# Patient Record
Sex: Male | Born: 1955 | Race: Black or African American | Hispanic: No | Marital: Single | Smoking: Never smoker
Health system: Southern US, Community
[De-identification: ages and names within clinical notes are randomized; demographics above are authoritative.]

## PROBLEM LIST (undated history)

## (undated) DIAGNOSIS — I10 Essential (primary) hypertension: Secondary | ICD-10-CM

## (undated) DIAGNOSIS — E7800 Pure hypercholesterolemia, unspecified: Secondary | ICD-10-CM

## (undated) DIAGNOSIS — E119 Type 2 diabetes mellitus without complications: Secondary | ICD-10-CM

## (undated) HISTORY — PX: OTHER SURGICAL HISTORY: SHX169

## (undated) HISTORY — PX: EYE SURGERY: SHX253

---

## 2012-06-13 DIAGNOSIS — R42 Dizziness and giddiness: Secondary | ICD-10-CM

## 2016-06-10 ENCOUNTER — Emergency Department (HOSPITAL_COMMUNITY): Payer: Medicaid - Out of State

## 2016-06-10 ENCOUNTER — Emergency Department (HOSPITAL_COMMUNITY)
Admission: EM | Admit: 2016-06-10 | Discharge: 2016-06-10 | Disposition: A | Discharge status: Home / Self Care | Payer: Medicaid - Out of State | Attending: Emergency Medicine | Admitting: Emergency Medicine

## 2016-06-10 ENCOUNTER — Encounter (HOSPITAL_COMMUNITY): Payer: Self-pay | Admitting: *Deleted

## 2016-06-10 DIAGNOSIS — Z79899 Other long term (current) drug therapy: Secondary | ICD-10-CM | POA: Insufficient documentation

## 2016-06-10 DIAGNOSIS — Z7982 Long term (current) use of aspirin: Secondary | ICD-10-CM | POA: Insufficient documentation

## 2016-06-10 DIAGNOSIS — R112 Nausea with vomiting, unspecified: Secondary | ICD-10-CM | POA: Diagnosis present

## 2016-06-10 DIAGNOSIS — I10 Essential (primary) hypertension: Secondary | ICD-10-CM | POA: Insufficient documentation

## 2016-06-10 DIAGNOSIS — Z7984 Long term (current) use of oral hypoglycemic drugs: Secondary | ICD-10-CM | POA: Insufficient documentation

## 2016-06-10 DIAGNOSIS — E860 Dehydration: Secondary | ICD-10-CM | POA: Insufficient documentation

## 2016-06-10 DIAGNOSIS — E119 Type 2 diabetes mellitus without complications: Secondary | ICD-10-CM | POA: Insufficient documentation

## 2016-06-10 HISTORY — DX: Type 2 diabetes mellitus without complications: E11.9

## 2016-06-10 HISTORY — DX: Essential (primary) hypertension: I10

## 2016-06-10 HISTORY — DX: Pure hypercholesterolemia, unspecified: E78.00

## 2016-06-10 LAB — URINALYSIS, ROUTINE W REFLEX MICROSCOPIC
Bilirubin Urine: NEGATIVE
GLUCOSE, UA: NEGATIVE mg/dL
KETONES UR: NEGATIVE mg/dL
LEUKOCYTES UA: NEGATIVE
Nitrite: NEGATIVE
PH: 5.5 (ref 5.0–8.0)
Specific Gravity, Urine: >1.03 — ABNORMAL HIGH (ref 1.005–1.030)

## 2016-06-10 LAB — CBC WITH DIFFERENTIAL/PLATELET
BASOS ABS: 0 10*3/uL (ref 0.0–0.1)
BASOS PCT: 0 %
Eosinophils Absolute: 0.1 10*3/uL (ref 0.0–0.7)
Eosinophils Relative: 1 %
HEMATOCRIT: 41.6 % (ref 39.0–52.0)
HEMOGLOBIN: 13.3 g/dL (ref 13.0–17.0)
LYMPHS PCT: 28 %
Lymphs Abs: 1.4 10*3/uL (ref 0.7–4.0)
MCH: 27.1 pg (ref 26.0–34.0)
MCHC: 32 g/dL (ref 30.0–36.0)
MCV: 84.7 fL (ref 78.0–100.0)
MONO ABS: 0.3 10*3/uL (ref 0.1–1.0)
Monocytes Relative: 5 %
NEUTROS ABS: 3.3 10*3/uL (ref 1.7–7.7)
NEUTROS PCT: 66 %
Platelets: 189 10*3/uL (ref 150–400)
RBC: 4.91 MIL/uL (ref 4.22–5.81)
RDW: 12.6 % (ref 11.5–15.5)
WBC: 5 10*3/uL (ref 4.0–10.5)

## 2016-06-10 LAB — RAPID URINE DRUG SCREEN, HOSP PERFORMED
AMPHETAMINES: NOT DETECTED
BARBITURATES: NOT DETECTED
BENZODIAZEPINES: NOT DETECTED
Cocaine: NOT DETECTED
Opiates: NOT DETECTED
Tetrahydrocannabinol: NOT DETECTED

## 2016-06-10 LAB — URINE MICROSCOPIC-ADD ON

## 2016-06-10 LAB — COMPREHENSIVE METABOLIC PANEL
ALBUMIN: 4.2 g/dL (ref 3.5–5.0)
ALT: 13 U/L — AB (ref 17–63)
AST: 16 U/L (ref 15–41)
Alkaline Phosphatase: 38 U/L (ref 38–126)
Anion gap: 5 (ref 5–15)
BILIRUBIN TOTAL: 0.6 mg/dL (ref 0.3–1.2)
BUN: 12 mg/dL (ref 6–20)
CO2: 24 mmol/L (ref 22–32)
CREATININE: 1.35 mg/dL — AB (ref 0.61–1.24)
Calcium: 8.5 mg/dL — ABNORMAL LOW (ref 8.9–10.3)
Chloride: 111 mmol/L (ref 101–111)
GFR calc Af Amer: >60 mL/min (ref >60)
GFR, EST NON AFRICAN AMERICAN: 55 mL/min — AB (ref >60)
GLUCOSE: 126 mg/dL — AB (ref 65–99)
POTASSIUM: 4.4 mmol/L (ref 3.5–5.1)
Sodium: 140 mmol/L (ref 135–145)
TOTAL PROTEIN: 7 g/dL (ref 6.5–8.1)

## 2016-06-10 LAB — CBG MONITORING, ED: Glucose-Capillary: 134 mg/dL — ABNORMAL HIGH (ref 65–99)

## 2016-06-10 MED ORDER — ONDANSETRON 4 MG PO TBDP: ORAL_TABLET | ORAL | 0 refills | Status: AC

## 2016-06-10 MED ORDER — LOPERAMIDE HCL 2 MG PO CAPS: 2.0000 mg | ORAL_CAPSULE | Freq: Four times a day (QID) | ORAL | 0 refills | Status: AC | PRN

## 2016-06-10 NOTE — ED Notes (Signed)
Patient transported to X-ray 

## 2016-06-10 NOTE — ED Provider Notes (Signed)
[facility] Provider Note   CSN: [id number] Arrival date & time: 06/10/16  1408     History   Chief Complaint Chief Complaint  Patient presents with  . Emesis    HPI Jacob Osborn is a 60 y.o. male.  Patient complains of nausea with diarrhea for a number days.   The history is provided by the patient. No language interpreter was used.  Emesis   This is a new problem. The current episode started more than 2 days ago. The problem occurs 2 to 4 times per day. The problem has not changed since onset.The emesis has an appearance of stomach contents. There has been no fever. Associated symptoms include diarrhea. Pertinent negatives include no abdominal pain, no chills, no cough and no headaches. Risk factors: Unknown.    Past Medical History:  Diagnosis Date  . Diabetes mellitus without complication (HCC)   . High cholesterol   . Hypertension     There are no active problems to display for this patient.   Past Surgical History:  Procedure Laterality Date  . EYE SURGERY         Home Medications    Prior to Admission medications   Medication Sig Start Date End Date Taking? Authorizing Provider  acyclovir (ZOVIRAX) 400 MG tablet Take 400 mg by mouth 2 (two) times daily as needed (BREAKOUT).  06/04/16  Yes Historical Provider, MD  amLODipine (NORVASC) 10 MG tablet Take 10 mg by mouth daily. 05/31/16  Yes Historical Provider, MD  ASPIRIN LOW DOSE 81 MG EC tablet Take 81 mg by mouth daily. 06/04/16  Yes Historical Provider, MD  atorvastatin (LIPITOR) 10 MG tablet Take 10 mg by mouth daily. 05/31/16  Yes Historical Provider, MD  glipiZIDE (GLUCOTROL) 10 MG tablet Take 10 mg by mouth 2 (two) times daily. 05/31/16  Yes Historical Provider, MD  lisinopril (PRINIVIL,ZESTRIL) 20 MG tablet Take 20 mg by mouth daily. 05/31/16  Yes Historical Provider, MD  meclizine (ANTIVERT) 25 MG tablet Take 25 mg by mouth 3 (three) times daily as needed for dizziness.  05/31/16  Yes Historical  Provider, MD  metFORMIN (GLUCOPHAGE) 500 MG tablet Take 500 mg by mouth 2 (two) times daily. 05/31/16  Yes Historical Provider, MD  loperamide (IMODIUM) 2 MG capsule Take 1 capsule (2 mg total) by mouth 4 (four) times daily as needed for diarrhea or loose stools. 06/10/16   Bethann Berkshire, MD  ondansetron (ZOFRAN ODT) 4 MG disintegrating tablet 4mg  ODT q4 hours prn nausea/vomit 06/10/16   Bethann Berkshire, MD    Family History No family history on file.  Social History Social History  Substance Use Topics  . Smoking status: Never Smoker  . Smokeless tobacco: Never Used  . Alcohol use No     Allergies   Review of patient's allergies indicates no known allergies.   Review of Systems Review of Systems  Constitutional: Negative for appetite change, chills and fatigue.  HENT: Negative for congestion, ear discharge and sinus pressure.   Eyes: Negative for discharge.  Respiratory: Negative for cough.   Cardiovascular: Negative for chest pain.  Gastrointestinal: Positive for diarrhea and vomiting. Negative for abdominal pain.  Genitourinary: Negative for frequency and hematuria.  Musculoskeletal: Negative for back pain.  Skin: Negative for rash.  Neurological: Negative for seizures and headaches.  Psychiatric/Behavioral: Negative for hallucinations.     Physical Exam Updated Vital Signs BP 160/86 (BP Location: Right Arm)   Pulse 62   Temp 98.3 F (36.8 C) (Oral)  Resp 16   Ht 5\' 7"  (1.702 m)   Wt 148 lb 1 oz (67.2 kg)   SpO2 100%   BMI 23.19 kg/m   Physical Exam  Constitutional: He is oriented to person, place, and time. He appears well-developed.  HENT:  Head: Normocephalic.  Eyes: Conjunctivae and EOM are normal. No scleral icterus.  Neck: Neck supple. No thyromegaly present.  Cardiovascular: Normal rate and regular rhythm.  Exam reveals no gallop and no friction rub.   No murmur heard. Pulmonary/Chest: No stridor. He has no wheezes. He has no rales. He exhibits no  tenderness.  Abdominal: He exhibits no distension. There is no tenderness. There is no rebound.  Musculoskeletal: Normal range of motion. He exhibits no edema.  Lymphadenopathy:    He has no cervical adenopathy.  Neurological: He is oriented to person, place, and time. He exhibits normal muscle tone. Coordination normal.  Skin: No rash noted. No erythema.  Psychiatric: He has a normal mood and affect. His behavior is normal.     ED Treatments / Results  Labs (all labs ordered are listed, but only abnormal results are displayed) Labs Reviewed  COMPREHENSIVE METABOLIC PANEL - Abnormal; Notable for the following:       Result Value   Glucose, Bld 126 (*)    Creatinine, Ser 1.35 (*)    Calcium 8.5 (*)    ALT 13 (*)    GFR calc non Af Amer 55 (*)    All other components within normal limits  URINALYSIS, ROUTINE W REFLEX MICROSCOPIC (NOT AT [facility]) - Abnormal; Notable for the following:    Specific Gravity, Urine >1.030 (*)    Hgb urine dipstick TRACE (*)    Protein, ur TRACE (*)    All other components within normal limits  URINE MICROSCOPIC-ADD ON - Abnormal; Notable for the following:    Squamous Epithelial / LPF 0-5 (*)    Bacteria, UA RARE (*)    All other components within normal limits  CBG MONITORING, ED - Abnormal; Notable for the following:    Glucose-Capillary 134 (*)    All other components within normal limits  CBC WITH DIFFERENTIAL/PLATELET  URINE RAPID DRUG SCREEN, HOSP PERFORMED    EKG  EKG Interpretation None       Radiology Dg Abd Acute W/chest  Result Date: 06/10/2016 CLINICAL DATA:  Diarrhea. EXAM: DG ABDOMEN ACUTE W/ 1V CHEST COMPARISON:  None. FINDINGS: Right colonic fluid levels correlating with diarrhea history. No signs of colonic ileus. Overall nonobstructive bowel gas pattern. No concerning mass effect or calcification. No pneumoperitoneum. There is no edema, consolidation, effusion, or pneumothorax. Normal heart size and mediastinal contours.  IMPRESSION: No acute finding in the chest or abdomen. Electronically Signed   By: Marnee Spring M.D.   On: 06/10/2016 16:35    Procedures Procedures (including critical care time)  Medications Ordered in ED Medications - No data to display   Initial Impression / Assessment and Plan / ED Course  I have reviewed the triage vital signs and the nursing notes.  Pertinent labs & imaging results that were available during my care of the patient were reviewed by me and considered in my medical decision making (see chart for details).  Clinical Course    Nausea with diarrhea. Normal labs normal x-ray. Patient is mildly dehydrated. Patient told to drink any fluids and follow-up with his PCP  Final Clinical Impressions(s) / ED Diagnoses   Final diagnoses:  Dehydration    New Prescriptions New Prescriptions  LOPERAMIDE (IMODIUM) 2 MG CAPSULE    Take 1 capsule (2 mg total) by mouth 4 (four) times daily as needed for diarrhea or loose stools.   ONDANSETRON (ZOFRAN ODT) 4 MG DISINTEGRATING TABLET    4mg  ODT q4 hours prn nausea/vomit     Bethann BerkshireJoseph Riverlyn Kizziah, MD 06/10/16 1807

## 2016-06-10 NOTE — Discharge Instructions (Signed)
Drink plenty of fluids and follow-up with your family doctor next week for recheck °

## 2016-06-10 NOTE — ED Triage Notes (Signed)
Pt c/o nausea for the past few weeks, diarrhea for the past 4 days, intermittent abd pain.

## 2016-08-25 ENCOUNTER — Encounter (HOSPITAL_COMMUNITY): Payer: Self-pay

## 2016-08-25 ENCOUNTER — Emergency Department (HOSPITAL_COMMUNITY)
Admission: EM | Admit: 2016-08-25 | Discharge: 2016-08-25 | Disposition: A | Discharge status: Home / Self Care | Payer: Medicaid - Out of State | Attending: Emergency Medicine | Admitting: Emergency Medicine

## 2016-08-25 DIAGNOSIS — M545 Low back pain: Secondary | ICD-10-CM | POA: Diagnosis present

## 2016-08-25 DIAGNOSIS — B0229 Other postherpetic nervous system involvement: Secondary | ICD-10-CM | POA: Diagnosis not present

## 2016-08-25 DIAGNOSIS — E119 Type 2 diabetes mellitus without complications: Secondary | ICD-10-CM | POA: Insufficient documentation

## 2016-08-25 DIAGNOSIS — Z7982 Long term (current) use of aspirin: Secondary | ICD-10-CM | POA: Insufficient documentation

## 2016-08-25 DIAGNOSIS — Z7984 Long term (current) use of oral hypoglycemic drugs: Secondary | ICD-10-CM | POA: Insufficient documentation

## 2016-08-25 DIAGNOSIS — Z79899 Other long term (current) drug therapy: Secondary | ICD-10-CM | POA: Diagnosis not present

## 2016-08-25 DIAGNOSIS — I10 Essential (primary) hypertension: Secondary | ICD-10-CM | POA: Diagnosis not present

## 2016-08-25 LAB — URINALYSIS, ROUTINE W REFLEX MICROSCOPIC
Bilirubin Urine: NEGATIVE
Glucose, UA: NEGATIVE mg/dL
Hgb urine dipstick: NEGATIVE
Ketones, ur: NEGATIVE mg/dL
LEUKOCYTES UA: NEGATIVE
Nitrite: NEGATIVE
PROTEIN: NEGATIVE mg/dL
Specific Gravity, Urine: 1.013 (ref 1.005–1.030)
pH: 6 (ref 5.0–8.0)

## 2016-08-25 LAB — I-STAT CHEM 8, ED
BUN: 14 mg/dL (ref 6–20)
CHLORIDE: 104 mmol/L (ref 101–111)
Calcium, Ion: 1.16 mmol/L (ref 1.15–1.40)
Creatinine, Ser: 1.2 mg/dL (ref 0.61–1.24)
Glucose, Bld: 100 mg/dL — ABNORMAL HIGH (ref 65–99)
HCT: 41 % (ref 39.0–52.0)
Hemoglobin: 13.9 g/dL (ref 13.0–17.0)
POTASSIUM: 4.3 mmol/L (ref 3.5–5.1)
SODIUM: 142 mmol/L (ref 135–145)
TCO2: 26 mmol/L (ref 0–100)

## 2016-08-25 MED ORDER — GABAPENTIN 100 MG PO CAPS: 100.0000 mg | ORAL_CAPSULE | Freq: Three times a day (TID) | ORAL | 0 refills | Status: AC

## 2016-08-25 NOTE — ED Triage Notes (Signed)
Patient reports of lower back pain that goes into pelvis. States he is also having burning "in shaft of my penis". States he wants blood work to make sure kidneys are working well. Currently taking Mupirocin for skin infection.

## 2016-08-25 NOTE — ED Provider Notes (Signed)
[facility] Provider Note   CSN: [id number] Arrival date & time: 08/25/16  1429     History   Chief Complaint Chief Complaint  Patient presents with  . Back Pain    HPI Jacob Osborn is a 60 y.o. male.  HPI Patient states that 4-5 days ago he had a "herpes outbreak". Described as blistering to his low back and groin area. This was on the right side. Since then he has had a burning sensation to the right low back wrapping around to the groin. States he was recently checked for herpes 1/2 and is test came back negative. He denies any injury, numbness or weakness. No dysuria, incontinence or penile discharge.  Past Medical History:  Diagnosis Date  . Diabetes mellitus without complication (HCC)   . High cholesterol   . Hypertension     There are no active problems to display for this patient.   Past Surgical History:  Procedure Laterality Date  . EYE SURGERY         Home Medications    Prior to Admission medications   Medication Sig Start Date End Date Taking? Authorizing Provider  acyclovir (ZOVIRAX) 400 MG tablet Take 400 mg by mouth 2 (two) times daily as needed (BREAKOUT).  06/04/16  Yes Historical Provider, MD  amLODipine (NORVASC) 10 MG tablet Take 10 mg by mouth daily. 05/31/16  Yes Historical Provider, MD  ASPIRIN LOW DOSE 81 MG EC tablet Take 81 mg by mouth daily. 06/04/16  Yes Historical Provider, MD  atorvastatin (LIPITOR) 10 MG tablet Take 10 mg by mouth daily. 05/31/16  Yes Historical Provider, MD  glipiZIDE (GLUCOTROL) 10 MG tablet Take 10 mg by mouth 2 (two) times daily. 05/31/16  Yes Historical Provider, MD  lisinopril (PRINIVIL,ZESTRIL) 20 MG tablet Take 20 mg by mouth daily. 05/31/16  Yes Historical Provider, MD  loratadine (CLARITIN) 10 MG tablet Take 10 mg by mouth daily.   Yes Historical Provider, MD  meclizine (ANTIVERT) 25 MG tablet Take 25 mg by mouth 3 (three) times daily as needed for dizziness.  05/31/16  Yes Historical Provider, MD  metFORMIN  (GLUCOPHAGE) 500 MG tablet Take 500 mg by mouth 2 (two) times daily. 05/31/16  Yes Historical Provider, MD  mupirocin ointment (BACTROBAN) 2 % Place 1 application into the nose daily as needed (for infection).   Yes Historical Provider, MD  ondansetron (ZOFRAN ODT) 4 MG disintegrating tablet 4mg  ODT q4 hours prn nausea/vomit Patient taking differently: Take 4 mg by mouth every 4 (four) hours as needed for nausea or vomiting.  06/10/16  Yes Bethann BerkshireJoseph Zammit, MD  gabapentin (NEURONTIN) 100 MG capsule Take 1 capsule (100 mg total) by mouth 3 (three) times daily. 08/25/16   Loren Raceravid Teresha Hanks, MD  loperamide (IMODIUM) 2 MG capsule Take 1 capsule (2 mg total) by mouth 4 (four) times daily as needed for diarrhea or loose stools. Patient not taking: Reported on 08/25/2016 06/10/16   Bethann BerkshireJoseph Zammit, MD    Family History No family history on file.  Social History Social History  Substance Use Topics  . Smoking status: Never Smoker  . Smokeless tobacco: Never Used  . Alcohol use No     Allergies   Patient has no known allergies.   Review of Systems Review of Systems  Constitutional: Negative for chills and fever.  Respiratory: Negative for shortness of breath.   Cardiovascular: Negative for chest pain.  Gastrointestinal: Negative for abdominal pain, constipation, diarrhea, nausea and vomiting.  Genitourinary: Negative for dysuria, flank  pain, frequency, hematuria, penile swelling, scrotal swelling and testicular pain.  Musculoskeletal: Positive for back pain. Negative for myalgias, neck pain and neck stiffness.  Skin: Negative for rash and wound.  Neurological: Negative for dizziness, weakness, light-headedness, numbness and headaches.  All other systems reviewed and are negative.    Physical Exam Updated Vital Signs BP 158/89 (BP Location: Left Arm)   Pulse (!) 54   Temp 98.4 F (36.9 C) (Oral)   Resp 18   Ht 5\' 7"  (1.702 m)   Wt 150 lb (68 kg)   SpO2 100%   BMI 23.49 kg/m   Physical  Exam  Constitutional: He is oriented to person, place, and time. He appears well-developed and well-nourished.  HENT:  Head: Normocephalic and atraumatic.  Mouth/Throat: Oropharynx is clear and moist.  Eyes: EOM are normal. Pupils are equal, round, and reactive to light.  Neck: Normal range of motion. Neck supple.  Cardiovascular: Normal rate and regular rhythm.   Pulmonary/Chest: Effort normal and breath sounds normal.  Abdominal: Soft. Bowel sounds are normal. There is no tenderness. There is no rebound and no guarding.  Genitourinary: Penis normal. No penile tenderness.  Genitourinary Comments: No inguinal lymphadenopathy. No penile discharge. No scrotal swelling or tenderness.  Musculoskeletal: Normal range of motion. He exhibits no edema or tenderness.  No CVA tenderness bilaterally. No midline thoracic or lumbar tenderness. Negative straight leg raise bilaterally. Distal pulses are intact. No lower extremity swelling, asymmetry or tenderness.  Neurological: He is alert and oriented to person, place, and time.  5/5 motor 70s. Sensation is fully intact. Patient is ambulating without any difficulty.  Skin: Skin is warm and dry. Capillary refill takes less than 2 seconds. No rash noted. No erythema.  Psychiatric: He has a normal mood and affect. His behavior is normal.  Nursing note and vitals reviewed.    ED Treatments / Results  Labs (all labs ordered are listed, but only abnormal results are displayed) Labs Reviewed  I-STAT CHEM 8, ED - Abnormal; Notable for the following:       Result Value   Glucose, Bld 100 (*)    All other components within normal limits  URINALYSIS, ROUTINE W REFLEX MICROSCOPIC    EKG  EKG Interpretation None       Radiology No results found.  Procedures Procedures (including critical care time)  Medications Ordered in ED Medications - No data to display   Initial Impression / Assessment and Plan / ED Course  I have reviewed the triage  vital signs and the nursing notes.  Pertinent labs & imaging results that were available during my care of the patient were reviewed by me and considered in my medical decision making (see chart for details).  Clinical Course    Patient apparently has postherpetic neuralgia. No red flag signs or symptoms. Will start on gabapentin and have follow-up with his primary physician. Return precautions given.  Final Clinical Impressions(s) / ED Diagnoses   Final diagnoses:  Postherpetic neuralgia    New Prescriptions Discharge Medication List as of 08/25/2016  9:31 PM    START taking these medications   Details  gabapentin (NEURONTIN) 100 MG capsule Take 1 capsule (100 mg total) by mouth 3 (three) times daily., Starting Tue 08/25/2016, Print         Loren Raceravid Quince Santana, MD 08/26/16 [id number]

## 2018-02-25 ENCOUNTER — Other Ambulatory Visit: Payer: Self-pay

## 2018-02-25 ENCOUNTER — Encounter (HOSPITAL_COMMUNITY): Payer: Self-pay | Admitting: Emergency Medicine

## 2018-02-25 ENCOUNTER — Emergency Department (HOSPITAL_COMMUNITY)
Admission: EM | Admit: 2018-02-25 | Discharge: 2018-02-25 | Disposition: A | Discharge status: Home / Self Care | Payer: Medicaid - Out of State | Attending: Emergency Medicine | Admitting: Emergency Medicine

## 2018-02-25 DIAGNOSIS — R300 Dysuria: Secondary | ICD-10-CM

## 2018-02-25 DIAGNOSIS — Z7984 Long term (current) use of oral hypoglycemic drugs: Secondary | ICD-10-CM | POA: Diagnosis not present

## 2018-02-25 DIAGNOSIS — E119 Type 2 diabetes mellitus without complications: Secondary | ICD-10-CM | POA: Diagnosis not present

## 2018-02-25 DIAGNOSIS — N342 Other urethritis: Secondary | ICD-10-CM

## 2018-02-25 DIAGNOSIS — I10 Essential (primary) hypertension: Secondary | ICD-10-CM | POA: Diagnosis not present

## 2018-02-25 LAB — URINALYSIS, ROUTINE W REFLEX MICROSCOPIC
Bilirubin Urine: NEGATIVE
GLUCOSE, UA: NEGATIVE mg/dL
Hgb urine dipstick: NEGATIVE
Ketones, ur: NEGATIVE mg/dL
Leukocytes, UA: NEGATIVE
Nitrite: NEGATIVE
PROTEIN: 30 mg/dL — AB
Specific Gravity, Urine: 1.019 (ref 1.005–1.030)
pH: 7 (ref 5.0–8.0)

## 2018-02-25 MED ORDER — CEPHALEXIN 500 MG PO CAPS: 500.0000 mg | ORAL_CAPSULE | Freq: Three times a day (TID) | ORAL | 0 refills | Status: AC

## 2018-02-25 NOTE — ED Triage Notes (Signed)
Patient complaining of burning with urination x 1 month. States he saw PCP over 2 weeks ago and was told he had blood and protein in his urine. States he also had ultrasound on kidneys and bladder.

## 2018-02-25 NOTE — Discharge Instructions (Addendum)
We saw you in the ER for pain with urination. The urine analysis here does show bacteria in your urine along with mild protein.  There is no blood in your urine.  Given that you are already on acyclovir, we think that you might need antibiotics to cover for a urinary tract infection.  Please take the medicines as prescribed.  Please follow-up with the primary care doctor in 1 week and the urologist as planned.  If you wanted further STD evaluation you may consider visiting the health department in [place].

## 2018-02-25 NOTE — ED Provider Notes (Signed)
[facility] EMERGENCY DEPARTMENT Provider Note   CSN: [id number] Arrival date & time: 02/25/18  1443     History   Chief Complaint Chief Complaint  Patient presents with  . Dysuria    HPI Jacob Osborn is a 62 y.o. male.  HPI  62 year old male with history of diabetes comes in with chief complaint of burning with urination. Patient states that he has had off and on burning with urination and also rash around his penis for the last 1 month.  He saw his PCP, and he was told that his urine analysis was normal, however there was blood and protein in his urine.  Patient subsequently got an ultrasound of the kidneys which was unremarkable for acute findings, and given a urologist follow-up on June 16.  Patient states that he continues to have off-and-on burning with urination. At the moment, there is no rash, but when he gets the breakout, he has lesion over his penis and itching around his rectum. Patient was started on acyclovir by his PCP, but it does not seem like it has helped.  Patient also informs me that he had STD check done by his PCP which was normal, and that he has not had any intercourse in the last 1.5 years.   Past Medical History:  Diagnosis Date  . Diabetes mellitus without complication (HCC)   . High cholesterol   . Hypertension     There are no active problems to display for this patient.   Past Surgical History:  Procedure Laterality Date  . EYE SURGERY         Home Medications    Prior to Admission medications   Medication Sig Start Date End Date Taking? Authorizing Provider  acyclovir (ZOVIRAX) 400 MG tablet Take 400 mg by mouth 2 (two) times daily as needed (BREAKOUT).  06/04/16   [provider]  amLODipine (NORVASC) 10 MG tablet Take 10 mg by mouth daily. 05/31/16   [provider]  ASPIRIN LOW DOSE 81 MG EC tablet Take 81 mg by mouth daily. 06/04/16   [provider]  atorvastatin (LIPITOR) 10 MG tablet Take 10 mg by mouth  daily. 05/31/16   [provider]  cephALEXin (KEFLEX) 500 MG capsule Take 1 capsule (500 mg total) by mouth 3 (three) times daily. 02/25/18   Derwood KaplanNanavati, Trixy Loyola, MD  gabapentin (NEURONTIN) 100 MG capsule Take 1 capsule (100 mg total) by mouth 3 (three) times daily. 08/25/16   Loren RacerYelverton, David, MD  glipiZIDE (GLUCOTROL) 10 MG tablet Take 10 mg by mouth 2 (two) times daily. 05/31/16   [provider]  lisinopril (PRINIVIL,ZESTRIL) 20 MG tablet Take 20 mg by mouth daily. 05/31/16   [provider]  loperamide (IMODIUM) 2 MG capsule Take 1 capsule (2 mg total) by mouth 4 (four) times daily as needed for diarrhea or loose stools. Patient not taking: Reported on 08/25/2016 06/10/16   Bethann BerkshireZammit, Joseph, MD  loratadine (CLARITIN) 10 MG tablet Take 10 mg by mouth daily.    [provider]  meclizine (ANTIVERT) 25 MG tablet Take 25 mg by mouth 3 (three) times daily as needed for dizziness.  05/31/16   [provider]  metFORMIN (GLUCOPHAGE) 500 MG tablet Take 500 mg by mouth 2 (two) times daily. 05/31/16   [provider]  mupirocin ointment (BACTROBAN) 2 % Place 1 application into the nose daily as needed (for infection).    [provider]  ondansetron (ZOFRAN ODT) 4 MG disintegrating tablet 4mg  ODT  q4 hours prn nausea/vomit Patient taking differently: Take 4 mg by mouth every 4 (four) hours as needed for nausea or vomiting.  06/10/16   Bethann Berkshire, MD    Family History History reviewed. No pertinent family history.  Social History Social History   Tobacco Use  . Smoking status: Never Smoker  . Smokeless tobacco: Never Used  Substance Use Topics  . Alcohol use: No  . Drug use: No     Allergies   Patient has no known allergies.   Review of Systems Review of Systems  Constitutional: Negative for activity change and fever.  Gastrointestinal: Negative for nausea and vomiting.  Genitourinary: Positive for dysuria. Negative for discharge,  penile pain and penile swelling.     Physical Exam Updated Vital Signs BP (!) 164/86 (BP Location: Right Arm)   Pulse 63   Temp 97.9 F (36.6 C) (Oral)   Resp 16   Ht 5\' 7"  (1.702 m)   Wt 68.9 kg (152 lb)   SpO2 100%   BMI 23.81 kg/m   Physical Exam  Constitutional: He is oriented to person, place, and time. He appears well-developed.  HENT:  Head: Atraumatic.  Neck: Neck supple.  Cardiovascular: Normal rate.  Pulmonary/Chest: Effort normal.  Abdominal: Soft.  Genitourinary: Penis normal.  Genitourinary Comments: No inguinal lymphadenopathy  Neurological: He is alert and oriented to person, place, and time.  Skin: Skin is warm.  Nursing note and vitals reviewed.     ED Treatments / Results  Labs (all labs ordered are listed, but only abnormal results are displayed) Labs Reviewed  URINALYSIS, ROUTINE W REFLEX MICROSCOPIC - Abnormal; Notable for the following components:      Result Value   Protein, ur 30 (*)    Bacteria, UA MANY (*)    All other components within normal limits  URINE CULTURE    EKG None  Radiology No results found.  Procedures Procedures (including critical care time)  Medications Ordered in ED Medications - No data to display   Initial Impression / Assessment and Plan / ED Course  I have reviewed the triage vital signs and the nursing notes.  Pertinent labs & imaging results that were available during my care of the patient were reviewed by me and considered in my medical decision making (see chart for details).     62 year old patient with history of diabetes comes in with chief complaint of pain with urination.  Patient is not at risk for STDs, and reports that he had STD evaluation done by his PCP which was normal.  He states that he has been having intermittent outbreak of a rash, but at the moment there is no appreciable rash over his penis.  Patient also has had rectal irritation along with the dysuria.  No penile discharge.   UA today shows many bacteria.  Clinical concern would be highest for urethritis-UTI.  We will start patient on Keflex.  Patient is supposed to see urologist next week.  He already has had a urinary ultrasound which was unremarkable.  I have advised patient to continue seeing his primary care doctor and the urologist for further care, given there does not appear to be any emergent pathology in place.  Final Clinical Impressions(s) / ED Diagnoses   Final diagnoses:  Dysuria  Urethritis    ED Discharge Orders        Ordered    cephALEXin (KEFLEX) 500 MG capsule  3 times daily     02/25/18 1906  Derwood Kaplan, MD 02/25/18 [phone]

## 2018-02-27 LAB — URINE CULTURE: CULTURE: NO GROWTH

## 2018-11-13 ENCOUNTER — Other Ambulatory Visit: Payer: Self-pay

## 2018-11-13 ENCOUNTER — Emergency Department (HOSPITAL_COMMUNITY)
Admission: EM | Admit: 2018-11-13 | Discharge: 2018-11-13 | Disposition: A | Discharge status: Home / Self Care | Payer: Medicaid - Out of State | Attending: Emergency Medicine | Admitting: Emergency Medicine

## 2018-11-13 ENCOUNTER — Emergency Department (HOSPITAL_COMMUNITY): Payer: Medicaid - Out of State

## 2018-11-13 ENCOUNTER — Encounter (HOSPITAL_COMMUNITY): Payer: Self-pay | Admitting: Emergency Medicine

## 2018-11-13 DIAGNOSIS — I10 Essential (primary) hypertension: Secondary | ICD-10-CM | POA: Insufficient documentation

## 2018-11-13 DIAGNOSIS — Y999 Unspecified external cause status: Secondary | ICD-10-CM | POA: Diagnosis not present

## 2018-11-13 DIAGNOSIS — S39012A Strain of muscle, fascia and tendon of lower back, initial encounter: Secondary | ICD-10-CM | POA: Diagnosis not present

## 2018-11-13 DIAGNOSIS — E119 Type 2 diabetes mellitus without complications: Secondary | ICD-10-CM | POA: Insufficient documentation

## 2018-11-13 DIAGNOSIS — Z79899 Other long term (current) drug therapy: Secondary | ICD-10-CM | POA: Diagnosis not present

## 2018-11-13 DIAGNOSIS — Y939 Activity, unspecified: Secondary | ICD-10-CM | POA: Insufficient documentation

## 2018-11-13 DIAGNOSIS — Z7982 Long term (current) use of aspirin: Secondary | ICD-10-CM | POA: Diagnosis not present

## 2018-11-13 DIAGNOSIS — Z7984 Long term (current) use of oral hypoglycemic drugs: Secondary | ICD-10-CM | POA: Insufficient documentation

## 2018-11-13 DIAGNOSIS — Y929 Unspecified place or not applicable: Secondary | ICD-10-CM | POA: Insufficient documentation

## 2018-11-13 DIAGNOSIS — X509XXA Other and unspecified overexertion or strenuous movements or postures, initial encounter: Secondary | ICD-10-CM | POA: Diagnosis not present

## 2018-11-13 DIAGNOSIS — S3992XA Unspecified injury of lower back, initial encounter: Secondary | ICD-10-CM | POA: Diagnosis present

## 2018-11-13 MED ORDER — NAPROXEN 500 MG PO TABS: 500.0000 mg | ORAL_TABLET | Freq: Two times a day (BID) | ORAL | 0 refills | Status: AC

## 2018-11-13 MED ORDER — METHOCARBAMOL 750 MG PO TABS: 750.0000 mg | ORAL_TABLET | Freq: Four times a day (QID) | ORAL | 0 refills | Status: AC

## 2018-11-13 NOTE — ED Notes (Signed)
Pt reports low back pain for 5 days  Has physician in [place] but has not seen   Denies incontinence or numbness

## 2018-11-13 NOTE — ED Provider Notes (Signed)
[facility] EMERGENCY DEPARTMENT Provider Note   CSN: [id number] Arrival date & time: 11/13/18  1344    History   Chief Complaint Chief Complaint  Patient presents with  . Back Pain    HPI Jacob Osborn is a 63 y.o. male with a history of diabetes, hypertension and hypercholesterolemia presenting with a 5-day history of low back pain.  He reports occasional episodes of similar pain but not lasting as long as 5 days.  He denies any obvious injury, lifting accidents or falls.  His pain is worsened with certain positions and with change in position.  Lying flat on his back is particularly uncomfortable for him.  There is radiation of pain into his right lateral lower back.  There is no pain, weakness or numbness radiated into either leg.  He denies urinary or fecal incontinence or retention.  He also denies fevers or chills, no rash or other skin changes.  He has used ibuprofen and Tylenol without improvement in symptoms.  No cancer history, no IV drug use history.     The history is provided by the patient.    Past Medical History:  Diagnosis Date  . Diabetes mellitus without complication (HCC)   . High cholesterol   . Hypertension     There are no active problems to display for this patient.   Past Surgical History:  Procedure Laterality Date  . EYE SURGERY          Home Medications    Prior to Admission medications   Medication Sig Start Date End Date Taking? Authorizing Provider  acyclovir (ZOVIRAX) 400 MG tablet Take 400 mg by mouth 2 (two) times daily as needed (BREAKOUT).  06/04/16   [provider]  amLODipine (NORVASC) 10 MG tablet Take 10 mg by mouth daily. 05/31/16   [provider]  ASPIRIN LOW DOSE 81 MG EC tablet Take 81 mg by mouth daily. 06/04/16   [provider]  atorvastatin (LIPITOR) 10 MG tablet Take 10 mg by mouth daily. 05/31/16   [provider]  cephALEXin (KEFLEX) 500 MG capsule Take 1 capsule (500 mg total) by  mouth 3 (three) times daily. 02/25/18   Derwood Kaplan, MD  gabapentin (NEURONTIN) 100 MG capsule Take 1 capsule (100 mg total) by mouth 3 (three) times daily. 08/25/16   Loren Racer, MD  glipiZIDE (GLUCOTROL) 10 MG tablet Take 10 mg by mouth 2 (two) times daily. 05/31/16   [provider]  lisinopril (PRINIVIL,ZESTRIL) 20 MG tablet Take 20 mg by mouth daily. 05/31/16   [provider]  loperamide (IMODIUM) 2 MG capsule Take 1 capsule (2 mg total) by mouth 4 (four) times daily as needed for diarrhea or loose stools. Patient not taking: Reported on 08/25/2016 06/10/16   Bethann Berkshire, MD  loratadine (CLARITIN) 10 MG tablet Take 10 mg by mouth daily.    [provider]  meclizine (ANTIVERT) 25 MG tablet Take 25 mg by mouth 3 (three) times daily as needed for dizziness.  05/31/16   [provider]  metFORMIN (GLUCOPHAGE) 500 MG tablet Take 500 mg by mouth 2 (two) times daily. 05/31/16   [provider]  methocarbamol (ROBAXIN-750) 750 MG tablet Take 1 tablet (750 mg total) by mouth 4 (four) times daily. 11/13/18   Burgess Amor, PA-C  mupirocin ointment (BACTROBAN) 2 % Place 1 application into the nose daily as needed (for infection).    [provider]  naproxen (NAPROSYN) 500 MG tablet Take 1 tablet (500 mg  total) by mouth 2 (two) times daily. 11/13/18   Burgess Amor, PA-C  ondansetron (ZOFRAN ODT) 4 MG disintegrating tablet 4mg  ODT q4 hours prn nausea/vomit Patient taking differently: Take 4 mg by mouth every 4 (four) hours as needed for nausea or vomiting.  06/10/16   Bethann Berkshire, MD    Family History History reviewed. No pertinent family history.  Social History Social History   Tobacco Use  . Smoking status: Never Smoker  . Smokeless tobacco: Never Used  Substance Use Topics  . Alcohol use: No  . Drug use: No     Allergies   Patient has no known allergies.   Review of Systems Review of Systems  Constitutional: Negative for  fever.  Respiratory: Negative for shortness of breath.   Cardiovascular: Negative for chest pain and leg swelling.  Gastrointestinal: Negative for abdominal distention, abdominal pain and constipation.  Genitourinary: Negative for difficulty urinating, dysuria, flank pain, frequency and urgency.  Musculoskeletal: Positive for back pain. Negative for gait problem and joint swelling.  Skin: Negative for rash.  Neurological: Negative for weakness and numbness.     Physical Exam Updated Vital Signs BP 139/76 (BP Location: Right Arm)   Pulse 82   Temp 98.3 F (36.8 C) (Oral)   Resp 17   Ht 5\' 9"  (1.753 m)   Wt 68 kg   SpO2 100%   BMI 22.15 kg/m   Physical Exam Vitals signs and nursing note reviewed.  Constitutional:      Appearance: He is well-developed.  HENT:     Head: Normocephalic.  Eyes:     Conjunctiva/sclera: Conjunctivae normal.  Neck:     Musculoskeletal: Normal range of motion and neck supple.  Cardiovascular:     Rate and Rhythm: Normal rate.     Comments: Pedal pulses normal. Pulmonary:     Effort: Pulmonary effort is normal.  Abdominal:     General: Bowel sounds are normal. There is no distension.     Palpations: Abdomen is soft. There is no mass.  Musculoskeletal: Normal range of motion.     Lumbar back: He exhibits tenderness. He exhibits no swelling, no edema and no spasm.       Back:  Skin:    General: Skin is warm and dry.  Neurological:     Mental Status: He is alert.     Sensory: No sensory deficit.     Motor: No tremor or atrophy.     Gait: Gait normal.     Deep Tendon Reflexes:     Reflex Scores:      Patellar reflexes are 2+ on the right side and 2+ on the left side.      Achilles reflexes are 2+ on the right side and 2+ on the left side.    Comments: No strength deficit noted in hip and knee flexor and extensor muscle groups.  Ankle flexion and extension intact.      ED Treatments / Results  Labs (all labs ordered are listed, but only  abnormal results are displayed) Labs Reviewed - No data to display  EKG None  Radiology Dg Lumbar Spine Complete  Result Date: 11/13/2018 CLINICAL DATA:  Low back pain EXAM: LUMBAR SPINE - COMPLETE 4+ VIEW COMPARISON:  None. 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 Disc spaces maintained. Normal alignment. No fracture. SI joints symmetric and unremarkable. 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 No acute bony abnormality. Electronically Signed   By: Caryn Bee  Dover M.D.   On: 11/13/2018 16:17    Procedures Procedures (including critical care time)  Medications Ordered in ED Medications - No data to display   Initial Impression / Assessment and Plan / ED Course  I have reviewed the triage vital signs and the nursing notes.  Pertinent labs & imaging results that were available during my care of the patient were reviewed by me and considered in my medical decision making (see chart for details).        No neuro deficit on exam or by history to suggest emergent or surgical presentation.  discussed worsened sx that should prompt immediate re-evaluation including distal weakness, bowel/bladder retention/incontinence.      Final Clinical Impressions(s) / ED Diagnoses   Final diagnoses:  Strain of lumbar region, initial encounter    ED Discharge Orders         Ordered    methocarbamol (ROBAXIN-750) 750 MG tablet  4 times daily     11/13/18 1626    naproxen (NAPROSYN) 500 MG tablet  2 times daily     11/13/18 1626           Burgess Amor, Cordelia Poche 11/14/18 1306    Donnetta Hutching, MD 11/16/18 [phone]

## 2018-11-13 NOTE — ED Triage Notes (Signed)
Patient complains for lower back pain x 5 days. States pain is worse at night when he is lying down.

## 2018-11-13 NOTE — ED Notes (Signed)
To rad 

## 2018-11-13 NOTE — Discharge Instructions (Signed)
Do not drive within 4 hours of taking hydrocodone as this will make you drowsy.  Avoid lifting,  Bending,  Twisting or any other activity that worsens your pain over the next week.  Apply a heating pad to your lower back for 20 minutes several times daily. You should get rechecked if your symptoms are not better over the next 5 days,  Or you develop increased pain,  Weakness in your leg(s) or loss of bladder or bowel function - these are symptoms of a worsening condition.

## 2018-11-29 ENCOUNTER — Emergency Department (HOSPITAL_COMMUNITY): Payer: Medicaid - Out of State

## 2018-11-29 ENCOUNTER — Emergency Department (HOSPITAL_COMMUNITY)
Admission: EM | Admit: 2018-11-29 | Discharge: 2018-11-29 | Disposition: A | Discharge status: Home / Self Care | Payer: Medicaid - Out of State | Attending: Emergency Medicine | Admitting: Emergency Medicine

## 2018-11-29 ENCOUNTER — Encounter (HOSPITAL_COMMUNITY): Payer: Self-pay

## 2018-11-29 ENCOUNTER — Other Ambulatory Visit: Payer: Self-pay

## 2018-11-29 DIAGNOSIS — I10 Essential (primary) hypertension: Secondary | ICD-10-CM | POA: Insufficient documentation

## 2018-11-29 DIAGNOSIS — Z7984 Long term (current) use of oral hypoglycemic drugs: Secondary | ICD-10-CM | POA: Diagnosis not present

## 2018-11-29 DIAGNOSIS — R5383 Other fatigue: Secondary | ICD-10-CM

## 2018-11-29 DIAGNOSIS — Z79899 Other long term (current) drug therapy: Secondary | ICD-10-CM | POA: Diagnosis not present

## 2018-11-29 DIAGNOSIS — Z7982 Long term (current) use of aspirin: Secondary | ICD-10-CM | POA: Insufficient documentation

## 2018-11-29 DIAGNOSIS — H9203 Otalgia, bilateral: Secondary | ICD-10-CM | POA: Insufficient documentation

## 2018-11-29 DIAGNOSIS — K5900 Constipation, unspecified: Secondary | ICD-10-CM

## 2018-11-29 DIAGNOSIS — E119 Type 2 diabetes mellitus without complications: Secondary | ICD-10-CM | POA: Insufficient documentation

## 2018-11-29 LAB — CBC WITH DIFFERENTIAL/PLATELET
Abs Immature Granulocytes: 0.01 10*3/uL (ref 0.00–0.07)
Basophils Absolute: 0.1 10*3/uL (ref 0.0–0.1)
Basophils Relative: 1 %
EOS ABS: 0.1 10*3/uL (ref 0.0–0.5)
Eosinophils Relative: 2 %
HEMATOCRIT: 40.2 % (ref 39.0–52.0)
Hemoglobin: 12.4 g/dL — ABNORMAL LOW (ref 13.0–17.0)
Immature Granulocytes: 0 %
LYMPHS ABS: 2 10*3/uL (ref 0.7–4.0)
Lymphocytes Relative: 42 %
MCH: 26.7 pg (ref 26.0–34.0)
MCHC: 30.8 g/dL (ref 30.0–36.0)
MCV: 86.6 fL (ref 80.0–100.0)
Monocytes Absolute: 0.4 10*3/uL (ref 0.1–1.0)
Monocytes Relative: 9 %
Neutro Abs: 2.1 10*3/uL (ref 1.7–7.7)
Neutrophils Relative %: 46 %
Platelets: 219 10*3/uL (ref 150–400)
RBC: 4.64 MIL/uL (ref 4.22–5.81)
RDW: 12.4 % (ref 11.5–15.5)
WBC: 4.7 10*3/uL (ref 4.0–10.5)
nRBC: 0 % (ref 0.0–0.2)

## 2018-11-29 LAB — BASIC METABOLIC PANEL
Anion gap: 7 (ref 5–15)
BUN: 24 mg/dL — ABNORMAL HIGH (ref 8–23)
CALCIUM: 9.2 mg/dL (ref 8.9–10.3)
CO2: 24 mmol/L (ref 22–32)
Chloride: 106 mmol/L (ref 98–111)
Creatinine, Ser: 1.49 mg/dL — ABNORMAL HIGH (ref 0.61–1.24)
GFR calc Af Amer: 57 mL/min — ABNORMAL LOW (ref >60)
GFR calc non Af Amer: 49 mL/min — ABNORMAL LOW (ref >60)
Glucose, Bld: 160 mg/dL — ABNORMAL HIGH (ref 70–99)
Potassium: 4.8 mmol/L (ref 3.5–5.1)
Sodium: 137 mmol/L (ref 135–145)

## 2018-11-29 LAB — TROPONIN I: Troponin I: <0.03 ng/mL (ref <0.03)

## 2018-11-29 MED ORDER — PEG 3350-KCL-NA BICARB-NACL 420 G PO SOLR: 4000.0000 mL | Freq: Once | ORAL | 0 refills | Status: AC

## 2018-11-29 MED ORDER — SODIUM CHLORIDE 0.9 % IV BOLUS
1000.0000 mL | Freq: Once | INTRAVENOUS | Status: AC
  Administered 2018-11-29: 1000 mL via INTRAVENOUS

## 2018-11-29 NOTE — ED Provider Notes (Signed)
[facility] EMERGENCY DEPARTMENT Provider Note   CSN: [id number] Arrival date & time: 11/29/18  0827    History   Chief Complaint Chief Complaint  Patient presents with  . Otalgia  . Constipation    HPI Jacob Osborn is a 63 y.o. male.     Patient is a 63 year-old male with history of DM, HTN, Hyperlipidemia.  He presents today with complaints of multiple issues.  Most notably, he reports constipation for the past 6 days.  He has not had a bowel movement during this period of time.  He states he is tried magnesium citrate with no relief.  He denies fevers or chills.  He denies abdominal pain.  He denies nausea or vomiting.  He also complains of pressure in his ears, weakness/fatigue, feeling short of breath with exertion, blurry vision, and states that he developed chest pressure while driving here this morning.  This has resolved.  The history is provided by the patient.    Past Medical History:  Diagnosis Date  . Diabetes mellitus without complication (HCC)   . High cholesterol   . Hypertension     There are no active problems to display for this patient.   Past Surgical History:  Procedure Laterality Date  . EYE SURGERY    . hypercholesterol          Home Medications    Prior to Admission medications   Medication Sig Start Date End Date Taking? Authorizing Provider  acyclovir (ZOVIRAX) 400 MG tablet Take 400 mg by mouth 2 (two) times daily as needed (BREAKOUT).  06/04/16   [provider]  amLODipine (NORVASC) 10 MG tablet Take 10 mg by mouth daily. 05/31/16   [provider]  ASPIRIN LOW DOSE 81 MG EC tablet Take 81 mg by mouth daily. 06/04/16   [provider]  atorvastatin (LIPITOR) 10 MG tablet Take 10 mg by mouth daily. 05/31/16   [provider]  cephALEXin (KEFLEX) 500 MG capsule Take 1 capsule (500 mg total) by mouth 3 (three) times daily. 02/25/18   Derwood Kaplan, MD  gabapentin (NEURONTIN) 100 MG capsule Take 1 capsule  (100 mg total) by mouth 3 (three) times daily. 08/25/16   Loren Racer, MD  glipiZIDE (GLUCOTROL) 10 MG tablet Take 10 mg by mouth 2 (two) times daily. 05/31/16   [provider]  lisinopril (PRINIVIL,ZESTRIL) 20 MG tablet Take 20 mg by mouth daily. 05/31/16   [provider]  loperamide (IMODIUM) 2 MG capsule Take 1 capsule (2 mg total) by mouth 4 (four) times daily as needed for diarrhea or loose stools. Patient not taking: Reported on 08/25/2016 06/10/16   Bethann Berkshire, MD  loratadine (CLARITIN) 10 MG tablet Take 10 mg by mouth daily.    [provider]  meclizine (ANTIVERT) 25 MG tablet Take 25 mg by mouth 3 (three) times daily as needed for dizziness.  05/31/16   [provider]  metFORMIN (GLUCOPHAGE) 500 MG tablet Take 500 mg by mouth 2 (two) times daily. 05/31/16   [provider]  methocarbamol (ROBAXIN-750) 750 MG tablet Take 1 tablet (750 mg total) by mouth 4 (four) times daily. 11/13/18   Burgess Amor, PA-C  mupirocin ointment (BACTROBAN) 2 % Place 1 application into the nose daily as needed (for infection).    [provider]  naproxen (NAPROSYN) 500 MG tablet Take 1 tablet (500 mg total) by mouth 2 (two) times daily. 11/13/18   Burgess Amor, PA-C  ondansetron (ZOFRAN ODT) 4  MG disintegrating tablet 4mg  ODT q4 hours prn nausea/vomit Patient taking differently: Take 4 mg by mouth every 4 (four) hours as needed for nausea or vomiting.  06/10/16   Bethann BerkshireZammit, Joseph, MD    Family History No family history on file.  Social History Social History   Tobacco Use  . Smoking status: Never Smoker  . Smokeless tobacco: Never Used  Substance Use Topics  . Alcohol use: No  . Drug use: No     Allergies   Patient has no known allergies.   Review of Systems Review of Systems  All other systems reviewed and are negative.    Physical Exam Updated Vital Signs BP (!) 167/77 (BP Location: Right Arm)   Pulse 73   Temp 98.4 F (36.9 C)  (Oral)   Resp 18   Ht 5\' 9"  (1.753 m)   Wt 68.9 kg   SpO2 98%   BMI 22.45 kg/m   Physical Exam Vitals signs and nursing note reviewed.  Constitutional:      General: He is not in acute distress.    Appearance: He is well-developed. He is not diaphoretic.  HENT:     Head: Normocephalic and atraumatic.     Right Ear: Tympanic membrane normal.     Left Ear: Tympanic membrane normal.     Mouth/Throat:     Mouth: Mucous membranes are moist.     Pharynx: No oropharyngeal exudate.  Eyes:     Extraocular Movements: Extraocular movements intact.     Pupils: Pupils are equal, round, and reactive to light.  Neck:     Musculoskeletal: Normal range of motion and neck supple.  Cardiovascular:     Rate and Rhythm: Normal rate and regular rhythm.     Heart sounds: No murmur. No friction rub.  Pulmonary:     Effort: Pulmonary effort is normal. No respiratory distress.     Breath sounds: Normal breath sounds. No wheezing or rales.  Abdominal:     General: Bowel sounds are normal. There is no distension.     Palpations: Abdomen is soft.     Tenderness: There is no abdominal tenderness.  Musculoskeletal: Normal range of motion.        General: No swelling or tenderness.     Right lower leg: No edema.     Left lower leg: No edema.  Skin:    General: Skin is warm and dry.  Neurological:     General: No focal deficit present.     Mental Status: He is alert and oriented to person, place, and time.     Cranial Nerves: No cranial nerve deficit.     Sensory: No sensory deficit.     Motor: No weakness.     Coordination: Coordination normal.      ED Treatments / Results  Labs (all labs ordered are listed, but only abnormal results are displayed) Labs Reviewed  BASIC METABOLIC PANEL  CBC WITH DIFFERENTIAL/PLATELET  TROPONIN I    EKG EKG Interpretation  Date/Time:  Tuesday November 29 2018 08:42:16 EDT Ventricular Rate:  63 PR Interval:    QRS Duration: 116 QT Interval:  405 QTC  Calculation: 415 R Axis:   83 Text Interpretation:  Sinus rhythm Incomplete right bundle branch block Confirmed by Geoffery LyonseLo, Elita Dame ([id number]) on 11/29/2018 9:03:27 AM   Radiology No results found.  Procedures Procedures (including critical care time)  Medications Ordered in ED Medications  sodium chloride 0.9 % bolus 1,000 mL (has no administration in time  range)     Initial Impression / Assessment and Plan / ED Course  I have reviewed the triage vital signs and the nursing notes.  Pertinent labs & imaging results that were available during my care of the patient were reviewed by me and considered in my medical decision making (see chart for details).  Patient presents here with multiple complaints as outlined in the HPI, the most urgent appears to be constipation.  His work-up shows no obvious laboratory abnormalities and EKG is unremarkable.  He does have increased stool burden on his acute abdominal series.  This will be treated with TriLyte and patient follow-up with GI as needed.  He has requested a follow-up appointment with Dr. Darrick Penna.  Patient will be given the contact information for her office where he can call and make this appointment.  Final Clinical Impressions(s) / ED Diagnoses   Final diagnoses:  None    ED Discharge Orders    None       Geoffery Lyons, MD 11/29/18 1039

## 2018-11-29 NOTE — ED Triage Notes (Addendum)
Pt reports has been constipated x 6 days.  Reports LBM was 6 days ago.  Pt also c/o bilateral earaches, fatigue, dizziness, blurred vision, and sob with exertion x 2 weeks.   Reports on the way to the er he started having cp but not at this time.

## 2018-11-29 NOTE — Discharge Instructions (Signed)
TriLyte as prescribed.  Follow-up with gastroenterology as needed.  The contact information for Dr. Evelina Dun office has been provided in this discharge summary at your request so that you can call and make this appointment.

## 2018-11-30 ENCOUNTER — Encounter: Payer: Self-pay | Admitting: Gastroenterology

## 2019-02-02 ENCOUNTER — Ambulatory Visit: Payer: Medicaid - Out of State | Admitting: Gastroenterology

## 2019-03-16 ENCOUNTER — Ambulatory Visit: Payer: Medicaid - Out of State | Admitting: Gastroenterology

## 2019-10-10 ENCOUNTER — Encounter: Payer: Self-pay | Admitting: Gastroenterology

## 2019-10-20 ENCOUNTER — Ambulatory Visit: Payer: Medicare Other | Admitting: Gastroenterology

## 2019-11-02 ENCOUNTER — Encounter: Payer: Self-pay | Admitting: Gastroenterology

## 2019-11-02 ENCOUNTER — Other Ambulatory Visit: Payer: Self-pay

## 2019-11-02 ENCOUNTER — Ambulatory Visit (INDEPENDENT_AMBULATORY_CARE_PROVIDER_SITE_OTHER): Payer: Medicare Other | Admitting: Gastroenterology

## 2019-11-02 ENCOUNTER — Telehealth: Payer: Self-pay | Admitting: Gastroenterology

## 2019-11-02 DIAGNOSIS — R1013 Epigastric pain: Secondary | ICD-10-CM

## 2019-11-02 DIAGNOSIS — R109 Unspecified abdominal pain: Secondary | ICD-10-CM

## 2019-11-02 DIAGNOSIS — R194 Change in bowel habit: Secondary | ICD-10-CM | POA: Diagnosis not present

## 2019-11-02 MED ORDER — PEG 3350-KCL-NA BICARB-NACL 420 G PO SOLR: 4000.0000 mL | ORAL | 0 refills | Status: AC

## 2019-11-02 MED ORDER — OMEPRAZOLE 20 MG PO CPDR: DELAYED_RELEASE_CAPSULE | ORAL | 3 refills | Status: AC

## 2019-11-02 NOTE — Assessment & Plan Note (Addendum)
SYMPTOMS NOT IDEALLY CONTROLLED WITH MIRALAX AND LINZESS. DIFFERENTIAL DIAGNOSIS INCLUDES IDIOPATHIC CONSTIPATION, COLON STRICTURE/OBSTRUCTION, & LESS LIKELY COLON CA.  COMPLETE CT SCAN WITHIN 7 DAYS. COMPLETE COLONOSCOPY AND UPPER ENDOSCOPY. YOU MAY BRING THE ENEMA TO ADMINISTER IN THE PREOP AREA. PT DOES NOT WANT SEDATION BUT NEEDS AN IV.  HOLD GLUCOTROL DAY OF PROCEDURE. CONTINUE METFORMIN.DISCUSSED PROCEDURE, BENEFITS, & RISKS: < 1% chance of medication reaction, bleeding, perforation, ASPIRATION, or rupture of spleen/liver requiring surgery to fix it and missed polyps < 1 cm 10-20% of the time. CONTINUE MIRALAX.  FOLLOW UP IN THE OFFICE WILL BE SCHEDULED IF NEEDED AFTER ENDOSCOPY.

## 2019-11-02 NOTE — Progress Notes (Signed)
Cc'ed to pcp °

## 2019-11-02 NOTE — Patient Instructions (Addendum)
YOUR UPSET STOMACH IS MOST LIKELY DUE TO ASPIRIN USE AND CONSTIPATION.     1. TO PREVENT REGURGITATION AND BURNING IN STOMACH, START OMEPRAZOLE.  TAKE 30 MINUTES PRIOR TO YOUR FIRST MEAL.   2. CONTINUE MIRALAX.   COMPLETE CT SCAN WITHIN 7 DAYS.  COMPLETE COLONOSCOPY AND UPPER ENDOSCOPY. YOU MAY BRING THE ENEMA TO ADMINISTER IN THE PREOP AREA.  FOLLOW UP IN THE OFFICE WILL BE SCHEDULED IF NEEDED AFTER ENDOSCOPY.

## 2019-11-02 NOTE — Progress Notes (Signed)
Subjective:    Patient ID: Jacob Osborn, male    DOB: 1956/09/02, 64 y.o.   MRN: [id number]  Vanessa [place], FNP   HPI CHANGE IN BOWEL IN HABITS-TROUBLE WITH CONSTIPATION:  BM WOULD COME DOWN AND WOULDN'T COME OUT FOR PAST YEAR. FEEL SLIKE STOOL SITS HIGH IN ABDOMEN AND THAT IS NEW. WEIGHT REMAINS STABLE(154 LBS TODAY). APPETITE: GOOD ENERGY L;EVEL: STAYS TIRED A LOT. EATS OUT MEATS BUT COOKS VEGETABLE AT HOME.  WAKES UP IN AM AND HAS A BIG LUMP. MASSAGES IT AND IT GOES AWAY AND THEN COMES BACK. ITCHING AND BURNING AT NAVEL(EVERY 3 DAYS). RECTUM MAY HAVE BLEEDING/BURNING.  IF DRINKS SOMETHING IT WILL BURN ON THE INSIDE([place] SLAW), DEPENDS ON WHAT HE EATS. TAKING LINZESS AND MIRALAX. IF DOESN'T TAKE MIRALAX, HE WON'T HAVE A BMs. NEVER HAD A COLONOSCOPY. STAYS COLD MOSTLY EVERY DAY. NAUSEA AND TAKES PILLS THAT HELPS SOME. LAST WATERY STOOL: 3 DAYS AGO. TAKES MIRALAX 1/2 CAP EVENING AN 12 MN: 1/2 CAP MORE. AT NIGHT FEELS LIKE FOOD COMES BACK UP AND THROAT WILL BURN. FEELS LIKE FOOD GETS TO A SPOT IN RUQ AND STOPS.    PT DENIES FEVER, CHILLS, HEMATOCHEZIA, HEMATEMESIS, nausea, vomiting, melena,  CHEST PAIN, SHORTNESS OF BREATH, problems swallowing, problems with sedation, OR heartburn or indigestion.  Past Medical History:  Diagnosis Date  . Diabetes mellitus without complication (HCC)   . High cholesterol   . Hypertension    Past Surgical History:  Procedure Laterality Date  . EYE SURGERY    . hypercholesterol     No Known Allergies  Current Outpatient Medications  Medication Sig    . acyclovir (ZOVIRAX) 400 MG tablet Take 400 mg by mouth as needed (BREAKOUT).     Marland Kitchen amLODipine (NORVASC) 10 MG tablet Take 10 mg by mouth daily.    . ASPIRIN LOW DOSE 81 MG EC tablet Take 81 mg by mouth daily.    Marland Kitchen atorvastatin (LIPITOR) 10 MG tablet Take 10 mg by mouth daily.    Marland Kitchen glipiZIDE (GLUCOTROL) 10 MG tablet Take 10 mg by mouth 2 (two) times daily.    Marland Kitchen lisinopril (PRINIVIL,ZESTRIL) 20 MG  tablet Take 20 mg by mouth daily.    Marland Kitchen loratadine (CLARITIN) 10 MG tablet Take 10 mg by mouth daily.    . meclizine (ANTIVERT) 25 MG tablet Take 25 mg by mouth every other day.     . metFORMIN (GLUCOPHAGE) 500 MG tablet Take 500 mg by mouth 2 (two) times daily.    . ondansetron (ZOFRAN ODT) 4 MG disintegrating tablet 4mg  ODT q4 hours prn nausea/vomit (Patient taking differently: Take 4 mg by mouth as needed for nausea or vomiting. ) 1X/WEEK   .      .      .      .      . metoprolol tartrate (LOPRESSOR) 25 MG tablet Take 1 tablet by mouth daily.    . mupirocin ointment (BACTROBAN) 2 % Place 1 application into the nose daily as needed (for infection).    .       Family History  Problem Relation Age of Onset  . Colon cancer Sister        AGE 34 DECEASED  . Colon polyps Neg Hx   . Stomach cancer Neg Hx     Social History   Socioeconomic History  . Marital status: Single    Spouse name: Not on file  . Number of children: Not on file  .  Years of education: Not on file  . Highest education level: Not on file  Occupational History  . Not on file  Tobacco Use  . Smoking status: Never Smoker  . Smokeless tobacco: Never Used  Substance and Sexual Activity  . Alcohol use: No  . Drug use: No  . Sexual activity: Not on file  Other Topics Concern  . Not on file  Social History Narrative   DISABLED FROM UNKNOWN SINCE 2007. 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 KIDS: NONE. LIVES ALONE. ETOH: NO. NO RECREATIONAL DRUGS. HAS FAMILY IN [place]. NO TOBACCO USE.    Social Determinants of Health   Financial Resource Strain:   . Difficulty of Paying Living Expenses: Not on file  Food Insecurity:   . Worried About Charity fundraiser in the Last Year: Not on file  . Ran Out of Food in the Last Year: Not on file  Transportation Needs:   . Lack of Transportation (Medical): Not on file  . Lack of Transportation (Non-Medical): Not on file  Physical Activity:   . Days of Exercise per  Week: Not on file  . Minutes of Exercise per Session: Not on file  Stress:   . Feeling of Stress : Not on file  Social Connections:   . Frequency of Communication with Friends and Family: Not on file  . Frequency of Social Gatherings with Friends and Family: Not on file  . Attends Religious Services: Not on file  . Active Member of Clubs or Organizations: Not on file  . Attends Archivist Meetings: Not on file  . Marital Status: Not on file   Review of Systems PER HPI OTHERWISE ALL SYSTEMS ARE NEGATIVE.    Objective:   Physical Exam Constitutional:      General: He is not in acute distress.    Appearance: Normal appearance.  HENT:     Mouth/Throat:     Comments: MASK IN PLACE Eyes:     General: No scleral icterus.    Pupils: Pupils are equal, round, and reactive to light.  Cardiovascular:     Rate and Rhythm: Normal rate and regular rhythm.     Pulses: Normal pulses.     Heart sounds: Normal heart sounds.  Pulmonary:     Effort: Pulmonary effort is normal.     Breath sounds: Normal breath sounds.  Abdominal:     General: Bowel sounds are normal.     Palpations: Abdomen is soft.     Tenderness: There is no abdominal tenderness.     Comments: MIDLINE BULGE WITH VALSALVA, UNABLE TO APPRECIATE BULGE IN RUQ  Musculoskeletal:     Cervical back: Normal range of motion.     Right lower leg: No edema.     Left lower leg: No edema.  Lymphadenopathy:     Cervical: No cervical adenopathy.  Skin:    General: Skin is warm and dry.  Neurological:     Mental Status: He is alert and oriented to person, place, and time.     Comments: NO  NEW FOCAL DEFICITS  Psychiatric:        Mood and Affect: Mood normal.     Comments: flat AFFECT       Assessment & Plan:

## 2019-11-02 NOTE — Telephone Encounter (Signed)
Patient came back into office and stated that the time between his ct and tcs was too long because "anything could change between now and then" and that Dr. Darrick Penna told him it would be done two weeks after his ct scan, if not before.  Darlina Rumpf offered to add him to the procedure cancellation list and he insisted to make another office visit appointment next Wednesday to talk about it with the doctor.

## 2019-11-02 NOTE — Assessment & Plan Note (Signed)
SYMPTOMS NOT IDEALLY CONTROLLED. DIFFERENTIAL DIAGNOSIS INCLUDES: H PYLORI GASTRITIS, LESS LIKELY PUD, OR  OCCULT PANCREATIC CANCER.  COMPLETE CT SCAN WITHIN 7 DAYS. COMPLETE COLONOSCOPY AND UPPER ENDOSCOPY. YOU MAY BRING THE ENEMA TO ADMINISTER IN THE PREOP AREA. CONTINUE MIRALAX. FOLLOW UP IN THE OFFICE WILL BE SCHEDULED IF NEEDED AFTER ENDOSCOPY.

## 2019-11-02 NOTE — Addendum Note (Signed)
Addended by: [place] on: 11/02/2019 11:06 AM   Modules accepted: Orders

## 2019-11-03 NOTE — Telephone Encounter (Signed)
Pt was offered to be placed on cancellation list yesterday at [facility] but he wanted to keep procedure as scheduled. CT is scheduled for 11/09/19.  Tried to call pt, no answer, LMOVM to inform pt he has been added to cancellation list per [facility]. Advised him he can call office to cancel OV 11/08/19.

## 2019-11-03 NOTE — Telephone Encounter (Signed)
REVIEWED. I EXPLAINED TO PT IF WE CANNOT GET HIM IN 2-3 WEEKS WE CAN PUT HIM ON A CANCELLATION LIST. HIS CT NEEDS TO BE SCHEDULED WITHIN 7 DAYS.

## 2019-11-08 ENCOUNTER — Telehealth: Payer: Self-pay

## 2019-11-08 ENCOUNTER — Ambulatory Visit: Payer: Medicare Other | Admitting: Gastroenterology

## 2019-11-08 ENCOUNTER — Telehealth: Payer: Self-pay | Admitting: Gastroenterology

## 2019-11-08 DIAGNOSIS — R109 Unspecified abdominal pain: Secondary | ICD-10-CM

## 2019-11-08 NOTE — Telephone Encounter (Signed)
Jacob Osborn at pre-service center [phone], est [phone]) called office, CT abd/pelvis scheduled for tomorrow needs PA from [facility]. PA can be submitted via [email or web address].  Case submitted on [email or web address]. Unable to locate [facility] for facility. Closest match was [facility]. Clinical notes uploaded to case. Tracking# [id number].  Called and informed Jacob Osborn of tracking# and unable to locate [name] for facility. She will reach out to her supervisor.

## 2019-11-08 NOTE — Telephone Encounter (Signed)
Received message from [place] at pre-service center, she got NPI for facility changed on PA.

## 2019-11-09 ENCOUNTER — Ambulatory Visit (HOSPITAL_COMMUNITY): Payer: Medicare Other

## 2019-11-13 NOTE — Telephone Encounter (Signed)
CT unable to be approved. Decision was based on clinical notes given.   Before we can approve the request, the following notes should be given: doctor's notes that say you had sound wave pictures (ultrasound) done first. The results should show why another test is needed. Or, doctor's notes that say you have pain at your belly bulge (bulging of skin (hernia)). The pain might mean there is a problem (such as bowel obstruction, incarceration, or strangulation). The notes could also say your doctor is going to do surgery. The information we got did not show these things have been done.   The ordering physician can schedule a peer-to-peer discussion with a physician reviewer [phone].

## 2019-11-20 NOTE — Telephone Encounter (Signed)
Called TO DISCUSS DENIAL. REF # [id number]. DR. Earley Favor RECOMMENDED [place] FIRST AND IF NEGATIVE THEN CT SCAN MAY BE APPROVED.  PT NEEDS AN ABDOMINAL US FIRST, Dx: CHANGE IN HABITS/ABDOMINAL HERNIA.

## 2019-11-20 NOTE — Telephone Encounter (Signed)
PLEASE CALL PT. HIS INSURANCE DENIED THE CT SCAN THEY WANT HIM TO GET AN [place] FIRST. WE WILL SCHEDULE THE [place] WITHIN 7 DAYS AND IF NOTHING IS FOUND ON IT TO ACCOUNT FOR HIS CHANGE IN BOWEL HABITS THEN WE WILL GET THE CT SCAN.

## 2019-11-21 NOTE — Telephone Encounter (Signed)
Called [employer], spoke to [place], no PA needed for [place] abd complete. Ref# [id number].

## 2019-11-21 NOTE — Telephone Encounter (Signed)
Tried to call pt, spoke to his brother. His brother will tell him to call office when he sees him.

## 2019-11-28 NOTE — Telephone Encounter (Signed)
Letter mailed

## 2020-01-19 ENCOUNTER — Other Ambulatory Visit (HOSPITAL_COMMUNITY)
Admission: RE | Admit: 2020-01-19 | Discharge: 2020-01-19 | Disposition: A | Discharge status: Home / Self Care | Payer: Medicare Other | Source: Ambulatory Visit | Attending: Gastroenterology | Admitting: Gastroenterology

## 2020-01-19 ENCOUNTER — Other Ambulatory Visit: Payer: Self-pay

## 2020-01-22 ENCOUNTER — Ambulatory Visit (HOSPITAL_COMMUNITY): Admission: RE | Admit: 2020-01-22 | Payer: Medicare Other | Source: Home / Self Care | Admitting: Gastroenterology

## 2020-01-22 ENCOUNTER — Telehealth: Payer: Self-pay | Admitting: Gastroenterology

## 2020-01-22 ENCOUNTER — Encounter (HOSPITAL_COMMUNITY): Admission: RE | Payer: Self-pay | Source: Home / Self Care

## 2020-01-22 SURGERY — COLONOSCOPY
Anesthesia: Moderate Sedation

## 2020-01-22 NOTE — Telephone Encounter (Signed)
REVIEWED-NO ADDITIONAL RECOMMENDATIONS. 

## 2020-01-22 NOTE — Telephone Encounter (Signed)
TCS for today was cancelled.  FYI to [facility]. Also was unable to reach pt previously about CT being denied and needing [place].

## 2020-01-22 NOTE — Telephone Encounter (Signed)
Jacob Osborn called to let us know that patient was a no show for his covid test and is scheduled with [facility] today.

## 2021-02-18 IMAGING — DX DG LUMBAR SPINE COMPLETE 4+V
5 series · 5 of 5 positions shown · non-contrast
Comparison: None.

CLINICAL DATA: Low back pain

EXAM:
LUMBAR SPINE - COMPLETE 4+ VIEW

[l-spine ap]
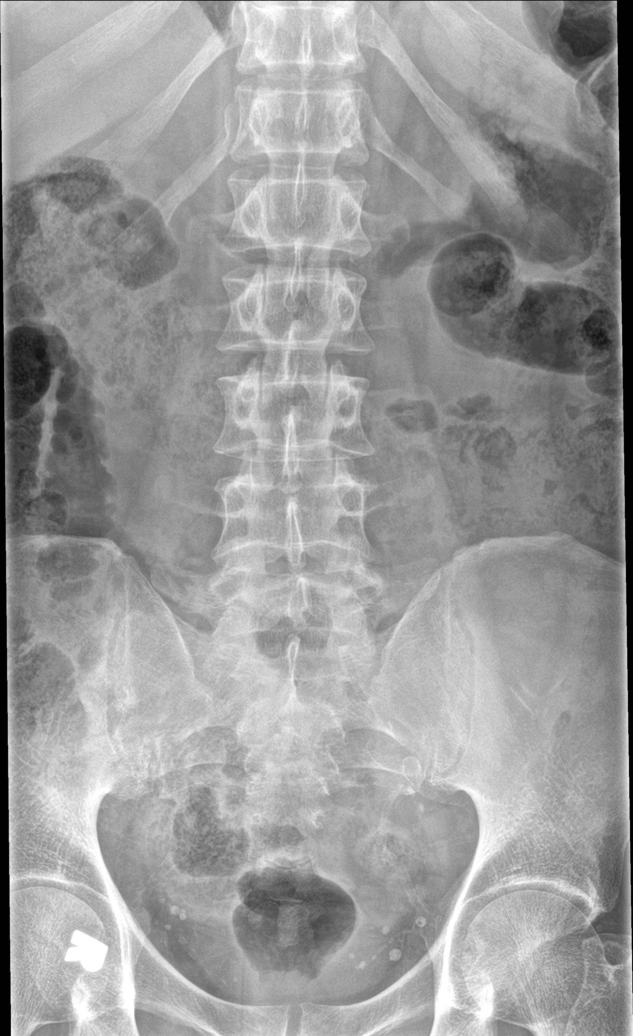

[l-spine obl (1 of 2)]
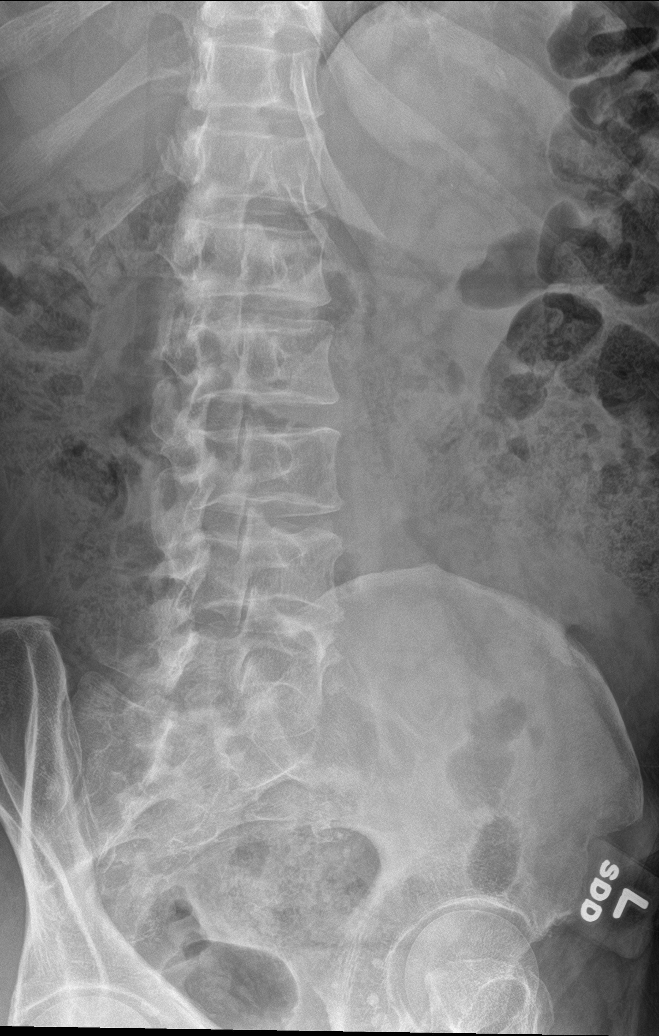

[l-spine obl (2 of 2)]
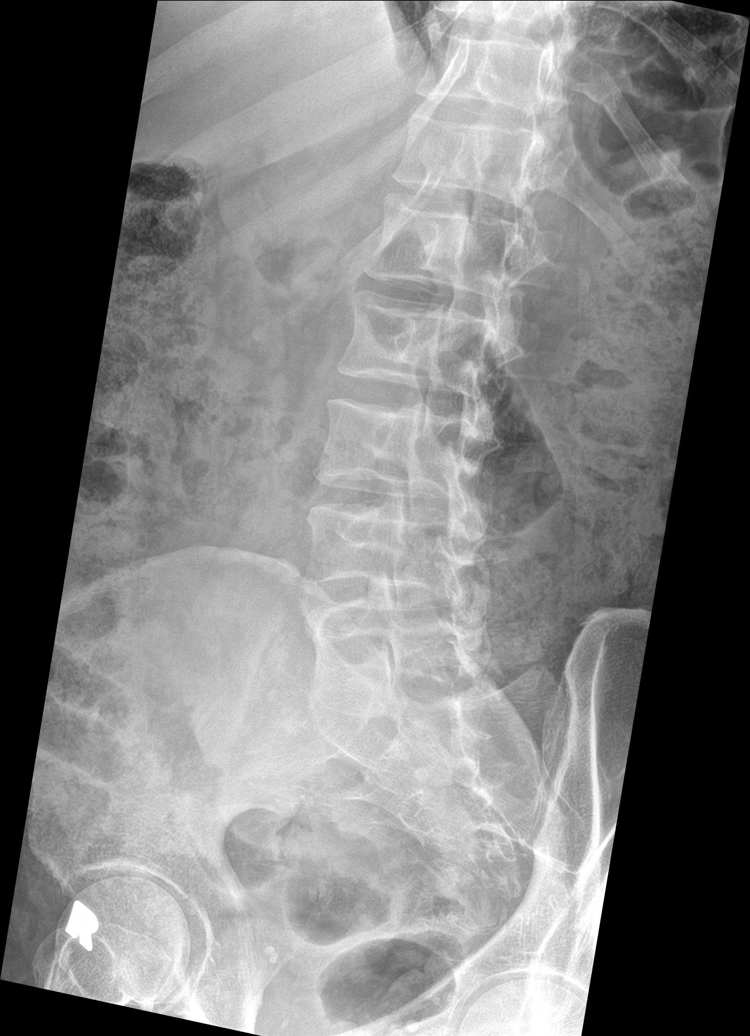

[l-spine lat]
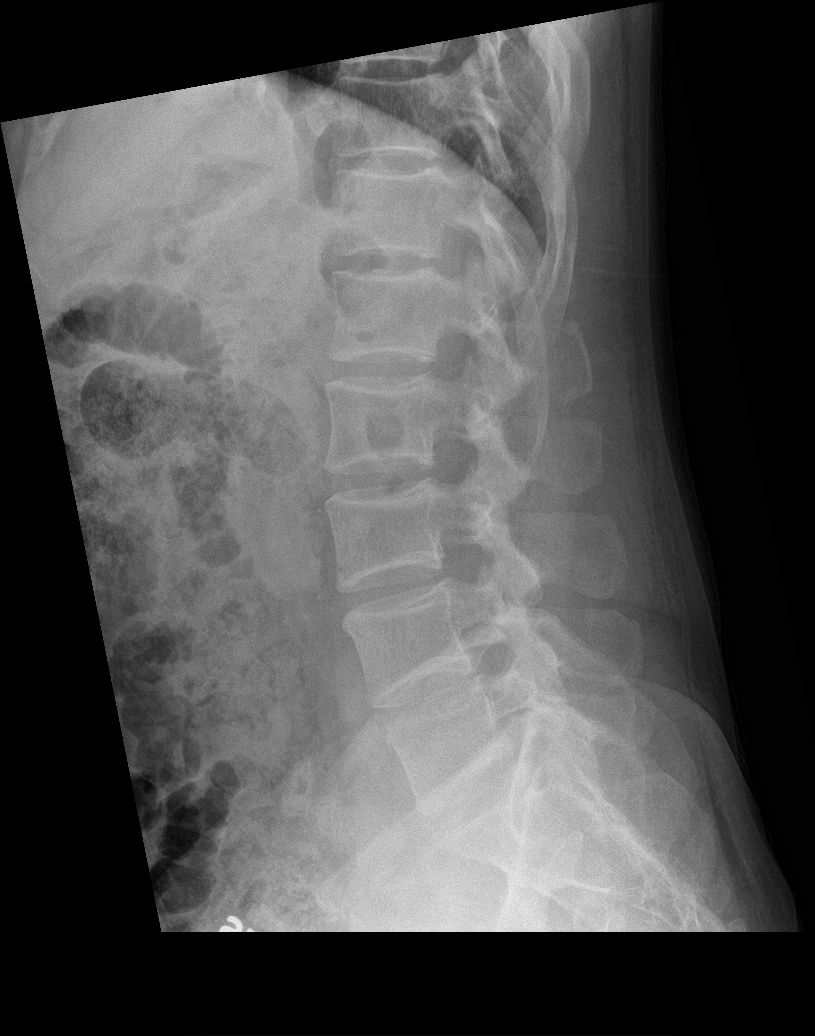

[l-spine spot]
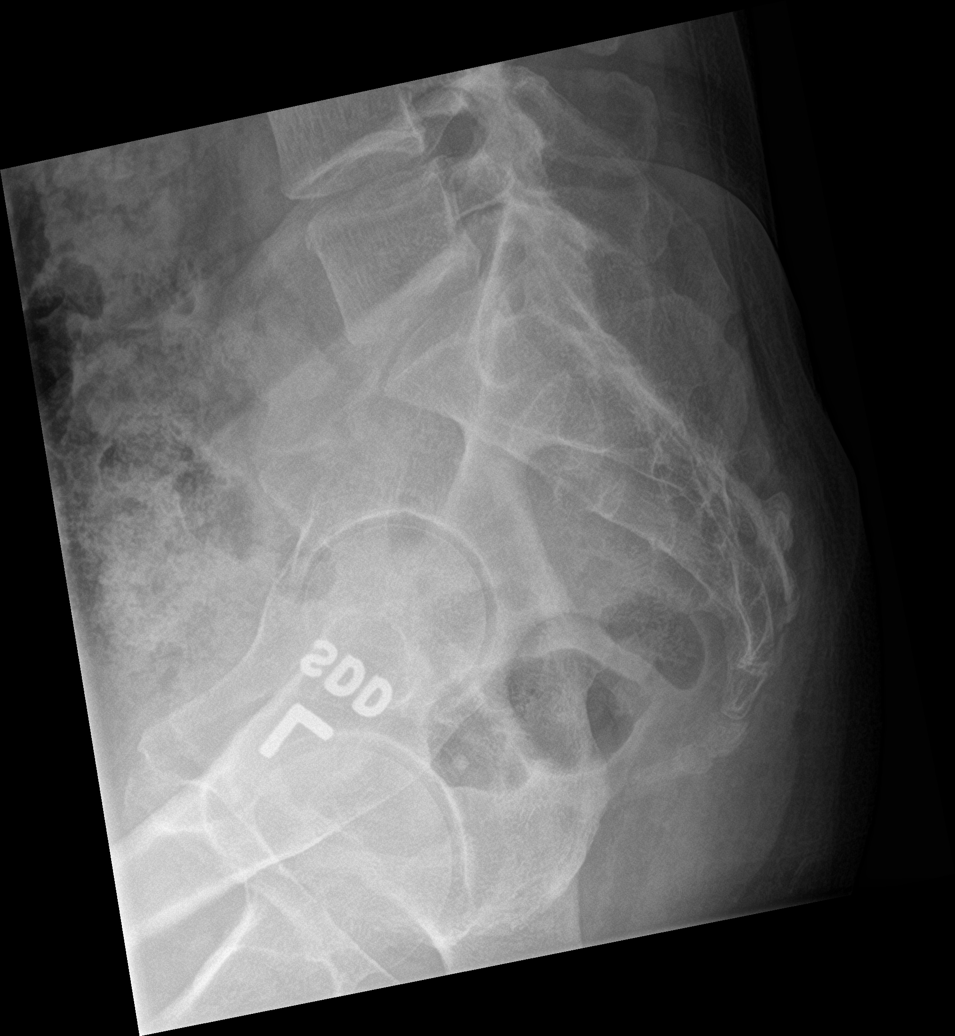

[5 of 5 positions shown; findings below may reference images not displayed]

FINDINGS: Mild degenerative facet disease in the lower lumbar spine. Disc
spaces maintained. Normal alignment. No fracture. SI joints
symmetric and unremarkable.
IMPRESSION: Mild degenerative facet disease in the lower lumbar spine. No acute
bony abnormality.

## 2021-03-06 IMAGING — DX DG ABDOMEN ACUTE W/ 1V CHEST
3 series · 3 of 3 positions shown · non-contrast
Comparison: 11/13/2018

CLINICAL DATA: Constipation for several months

EXAM:
DG ABDOMEN ACUTE W/ 1V CHEST

[chest pa]
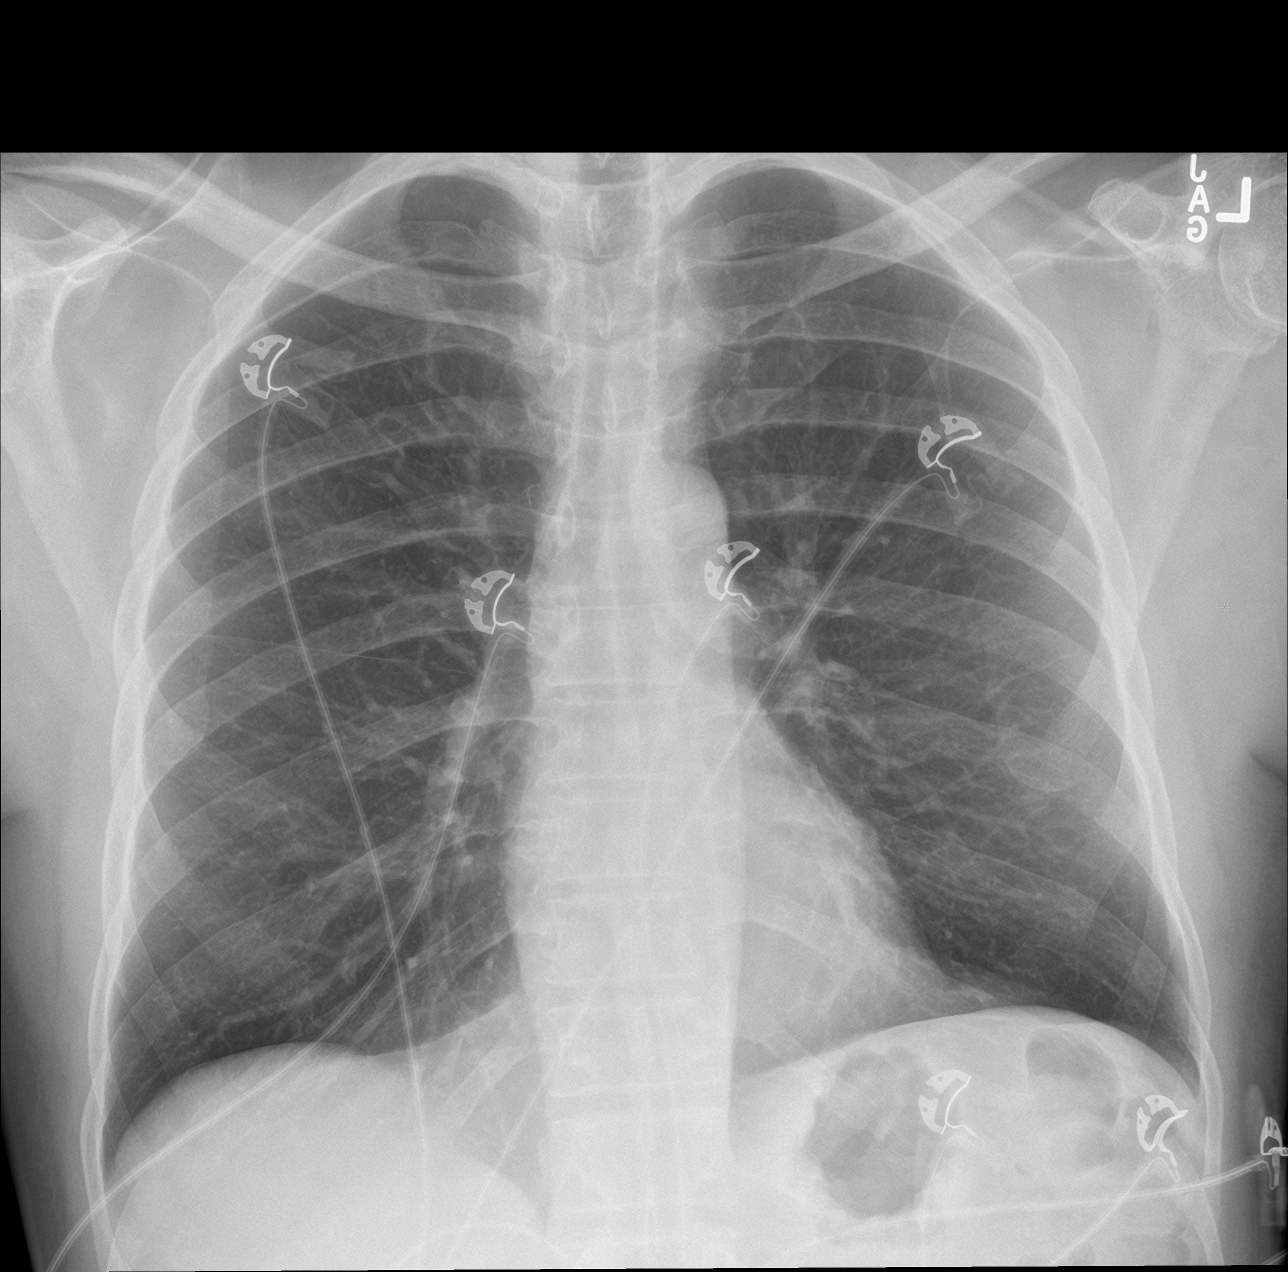

[abdomen erect]
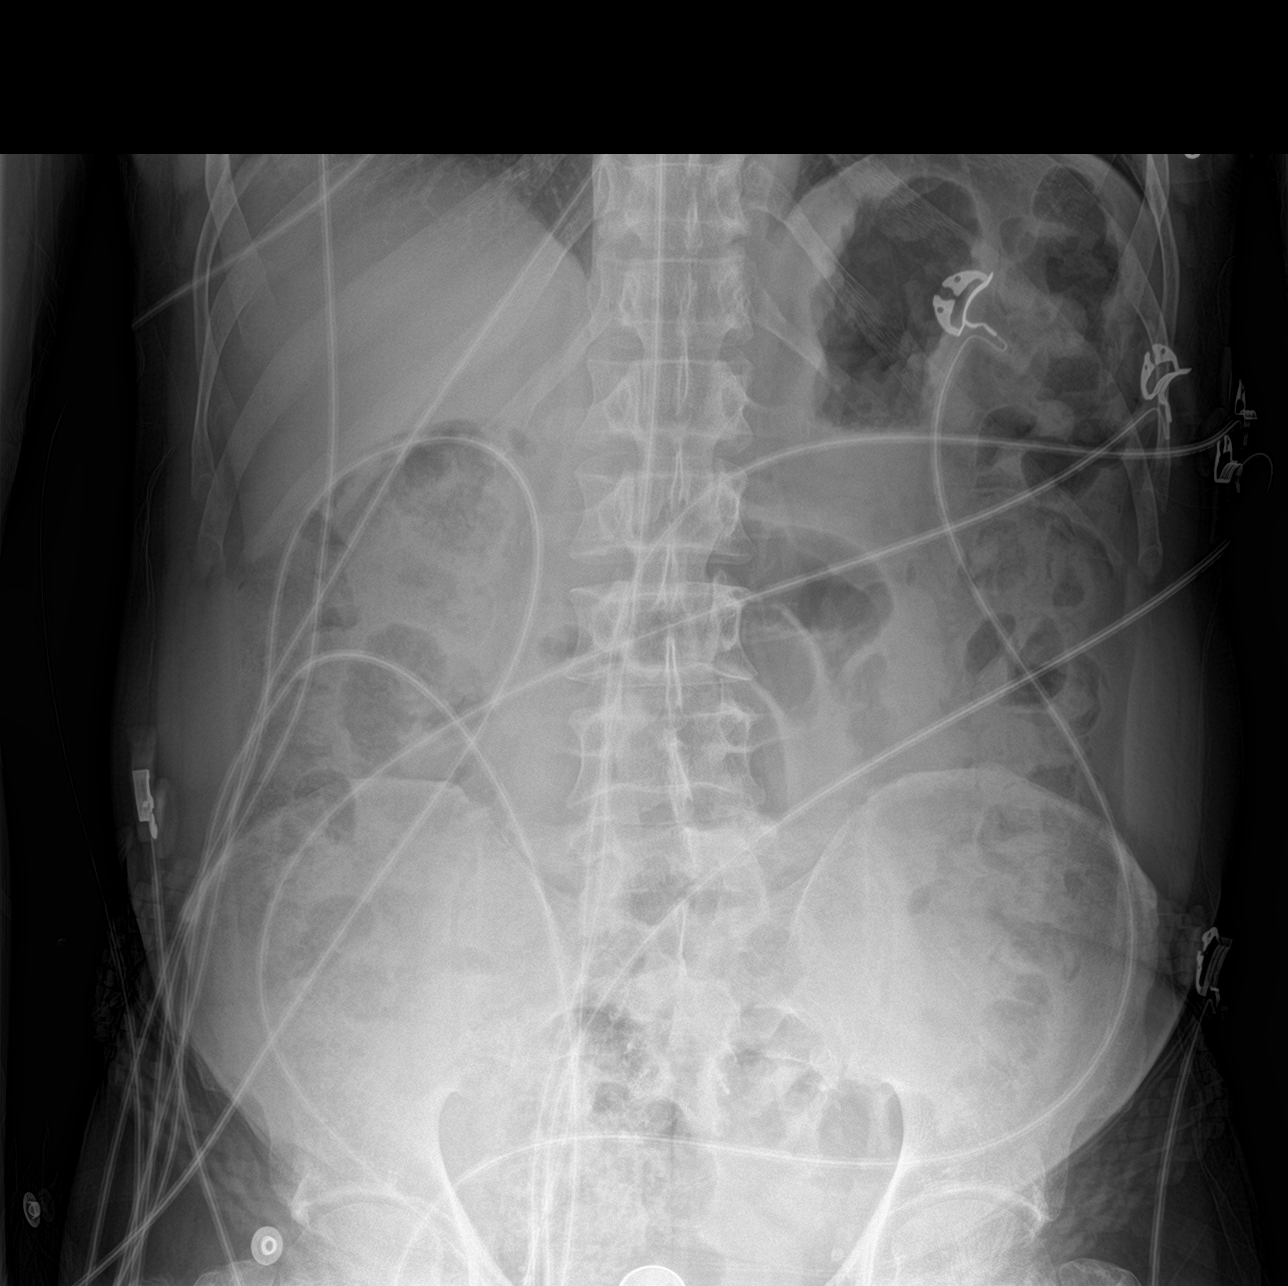

[abdomen supine]
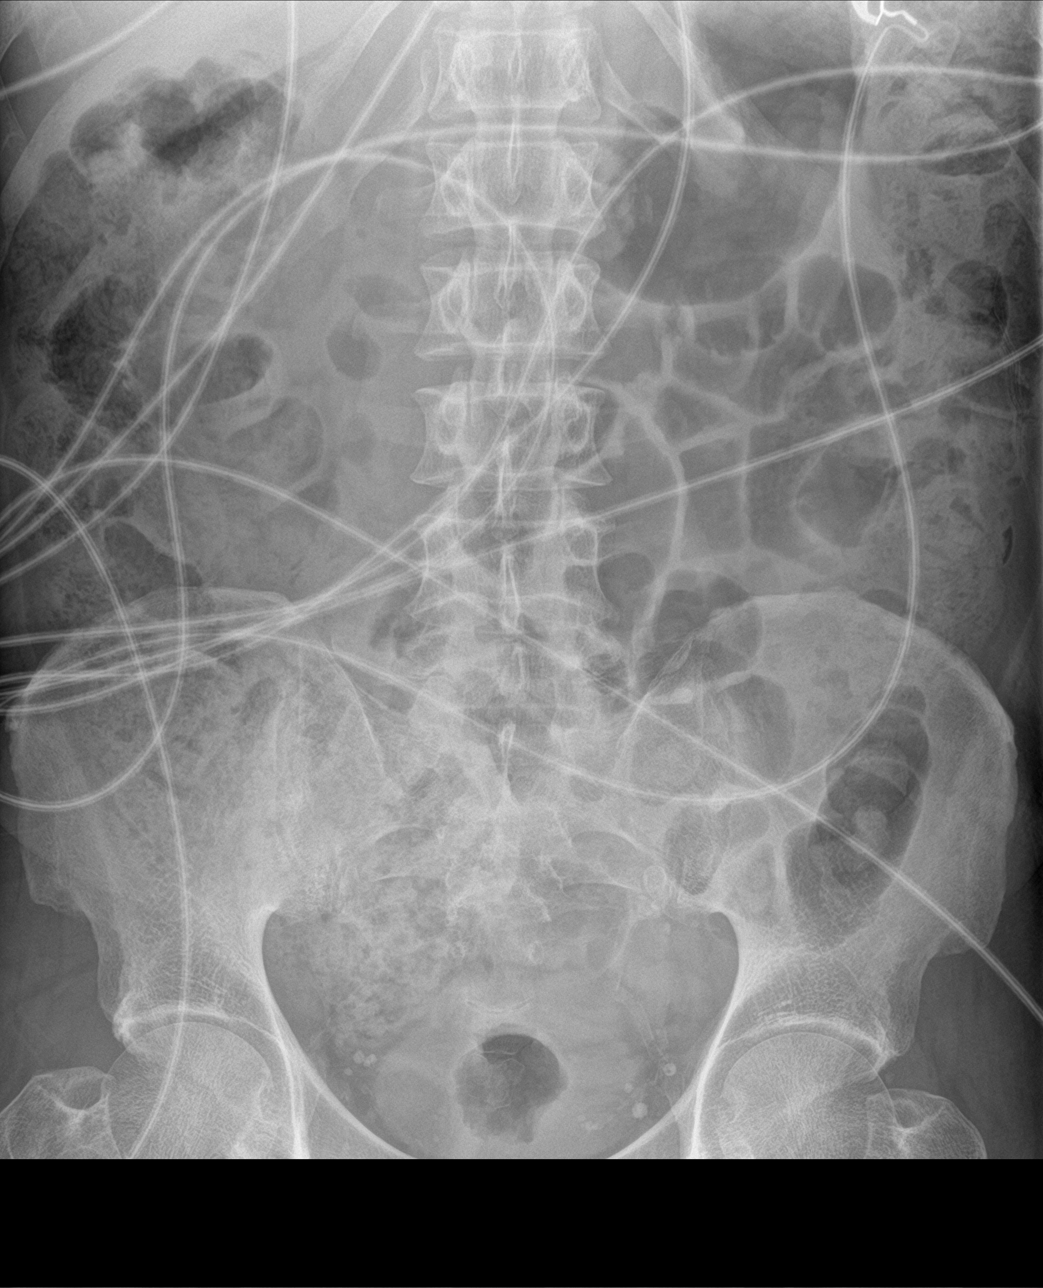

[3 of 3 positions shown; findings below may reference images not displayed]

FINDINGS: Cardiac shadow is within normal limits. The lungs are well aerated
bilaterally. No focal infiltrate or sizable effusion is seen.

Scattered large and small bowel gas is noted. Fecal material is
noted throughout the colon consistent with a degree of constipation
similar to that seen on prior exam. No obstructive changes are seen.
No bony abnormality is noted. No abnormal mass is seen.
IMPRESSION: Changes of constipation consistent with the given clinical history.
No other focal abnormality is noted.
